# Patient Record
Sex: Male | Born: 2000 | Race: Black or African American | Hispanic: No | Marital: Single | Smoking: Never smoker
Health system: Southern US, Community
[De-identification: ages and names within clinical notes are randomized; demographics above are authoritative.]

## PROBLEM LIST (undated history)

## (undated) HISTORY — PX: WISDOM TOOTH EXTRACTION: SHX21

---
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
## 2022-06-29 DIAGNOSIS — K080 Exfoliation of teeth due to systemic causes: Secondary | ICD-10-CM | POA: Diagnosis not present

## 2023-01-02 DIAGNOSIS — L2089 Other atopic dermatitis: Secondary | ICD-10-CM | POA: Diagnosis not present

## 2023-01-02 DIAGNOSIS — L818 Other specified disorders of pigmentation: Secondary | ICD-10-CM | POA: Diagnosis not present
